# Patient Record
Sex: Male | Born: 1965 | Race: Black or African American | Hispanic: No | Marital: Married | Smoking: Never smoker
Health system: Southern US, Community
[De-identification: ages and names within clinical notes are randomized; demographics above are authoritative.]

## PROBLEM LIST (undated history)

## (undated) DIAGNOSIS — E119 Type 2 diabetes mellitus without complications: Secondary | ICD-10-CM

## (undated) DIAGNOSIS — I10 Essential (primary) hypertension: Secondary | ICD-10-CM

---

## 2002-07-16 ENCOUNTER — Emergency Department (HOSPITAL_COMMUNITY): Admission: EM | Admit: 2002-07-16 | Discharge: 2002-07-17 | Payer: Self-pay | Admitting: Emergency Medicine

## 2002-07-17 ENCOUNTER — Ambulatory Visit (HOSPITAL_COMMUNITY): Admission: RE | Admit: 2002-07-17 | Discharge: 2002-07-17 | Payer: Self-pay | Admitting: Emergency Medicine

## 2015-09-08 ENCOUNTER — Encounter: Payer: Self-pay | Admitting: Internal Medicine

## 2015-12-14 ENCOUNTER — Emergency Department (HOSPITAL_COMMUNITY)
Admission: EM | Admit: 2015-12-14 | Discharge: 2015-12-15 | Disposition: A | Discharge status: Home / Self Care | Payer: BLUE CROSS/BLUE SHIELD | Attending: Emergency Medicine | Admitting: Emergency Medicine

## 2015-12-14 ENCOUNTER — Emergency Department (HOSPITAL_COMMUNITY): Payer: BLUE CROSS/BLUE SHIELD

## 2015-12-14 ENCOUNTER — Encounter (HOSPITAL_COMMUNITY): Payer: Self-pay | Admitting: Emergency Medicine

## 2015-12-14 DIAGNOSIS — M25521 Pain in right elbow: Secondary | ICD-10-CM | POA: Insufficient documentation

## 2015-12-14 DIAGNOSIS — R6883 Chills (without fever): Secondary | ICD-10-CM | POA: Diagnosis not present

## 2015-12-14 DIAGNOSIS — Z7984 Long term (current) use of oral hypoglycemic drugs: Secondary | ICD-10-CM | POA: Diagnosis not present

## 2015-12-14 DIAGNOSIS — E119 Type 2 diabetes mellitus without complications: Secondary | ICD-10-CM | POA: Diagnosis not present

## 2015-12-14 DIAGNOSIS — I10 Essential (primary) hypertension: Secondary | ICD-10-CM | POA: Diagnosis not present

## 2015-12-14 DIAGNOSIS — Z79899 Other long term (current) drug therapy: Secondary | ICD-10-CM | POA: Insufficient documentation

## 2015-12-14 DIAGNOSIS — M25421 Effusion, right elbow: Secondary | ICD-10-CM | POA: Diagnosis not present

## 2015-12-14 HISTORY — DX: Essential (primary) hypertension: I10

## 2015-12-14 HISTORY — DX: Type 2 diabetes mellitus without complications: E11.9

## 2015-12-14 MED ORDER — HYDROMORPHONE HCL 1 MG/ML IJ SOLN
1.0000 mg | Freq: Once | INTRAMUSCULAR | Status: AC
  Administered 2015-12-14: 1 mg via INTRAMUSCULAR
  Filled 2015-12-14: qty 1

## 2015-12-14 MED ORDER — KETOROLAC TROMETHAMINE 30 MG/ML IJ SOLN
30.0000 mg | Freq: Once | INTRAMUSCULAR | Status: AC
Start: 2015-12-14 — End: 2015-12-14
  Administered 2015-12-14: 30 mg via INTRAMUSCULAR
  Filled 2015-12-14: qty 1

## 2015-12-14 MED ORDER — OXYCODONE-ACETAMINOPHEN 5-325 MG PO TABS
1.0000 | ORAL_TABLET | Freq: Once | ORAL | Status: AC
  Administered 2015-12-14: 1 via ORAL
  Filled 2015-12-14: qty 1

## 2015-12-14 NOTE — ED Notes (Signed)
Patient presents for gout flare up in right elbow x1 day. Reports increased swelling of elbow. Sent by PCP for xray.

## 2015-12-14 NOTE — ED Provider Notes (Signed)
CSN: [id number]     Arrival date & time 12/14/15  1934 History  By signing my name below, I, Tanda Rockers, attest that this documentation has been prepared under the direction and in the presence of Gerhard Munch, MD. Electronically Signed: Tanda Rockers, ED Scribe. 12/14/2015. 11:35 PM.   Chief Complaint  Patient presents with  . Joint Swelling   The history is provided by the patient. No language interpreter was used.     HPI Comments: Mark Ingram is a 50 y.o. male with PMHx gout, DM, and HTN who presents to the Emergency Department complaining of gradual onset, constant, right elbow pain and swelling x 1 day. Pt was seen at [facility] today for his symptoms and sent here for further evaluation and possible arthrocentesis of his elbow. He reports feeling chills while at his PCPs as well. Pt usually has gout in his feet and his toes but has never had it in his elbow. Denies fever, vomiting, weakness, numbness, tingling, confusion, syncope, speech difficulty, color changes, or any other associated symptoms. Pt has hx DM and HTN and states his sugars and blood pressure have been running well lately.    Past Medical History  Diagnosis Date  . Diabetes mellitus without complication (HCC)   . Hypertension    History reviewed. No pertinent past surgical history. No family history on file. Social History  Substance Use Topics  . Smoking status: Never Smoker   . Smokeless tobacco: None  . Alcohol Use: No    Review of Systems  Constitutional: Positive for chills. Negative for fever.  Respiratory: Negative.   Cardiovascular: Negative.   Gastrointestinal: Negative for vomiting.  Musculoskeletal: Positive for joint swelling and arthralgias.  Skin: Negative for color change.  Allergic/Immunologic: Negative for immunocompromised state.  Neurological: Negative for syncope, speech difficulty, weakness and numbness.  Psychiatric/Behavioral: Negative for confusion.   Allergies   Review of patient's allergies indicates no known allergies.  Home Medications   Prior to Admission medications   Medication Sig Start Date End Date Taking? Authorizing Provider  lisinopril (PRINIVIL,ZESTRIL) 40 MG tablet Take 40 mg by mouth daily. 11/21/15  Yes Historical Provider, MD  metFORMIN (GLUCOPHAGE) 1000 MG tablet Take 1,000 mg by mouth 2 (two) times daily with a meal.  11/21/15  Yes Historical Provider, MD  simvastatin (ZOCOR) 20 MG tablet Take 20 mg by mouth daily. 11/21/15  Yes Historical Provider, MD  TRADJENTA 5 MG TABS tablet Take 5 mg by mouth daily. 11/13/15  Yes Historical Provider, MD   BP 133/92 mmHg  Pulse 66  Temp(Src) 98.8 F (37.1 C) (Oral)  Resp 20  SpO2 97%   Physical Exam  Constitutional: He appears well-developed and well-nourished. No distress.  HENT:  Head: Normocephalic and atraumatic.  Eyes: Conjunctivae are normal. Right eye exhibits no discharge. Left eye exhibits no discharge.  Neck: No tracheal deviation present. No thyromegaly present.  Cardiovascular: Regular rhythm and intact distal pulses.   Pulmonary/Chest: Effort normal and breath sounds normal. No respiratory distress.  Musculoskeletal:  Restrictive ROM of right elbow about 160 degrees and 90 degrees Minimal appreciable effusion, no warmth, no discoloration  Neurological: He is alert. No cranial nerve deficit. He exhibits normal muscle tone. Coordination normal.  Skin: Skin is warm and dry. He is not diaphoretic. No erythema.  Psychiatric: He has a normal mood and affect.    ED Course  Procedures (including critical care time)  DIAGNOSTIC STUDIES: Oxygen Saturation is 97% on RA, normal by my interpretation.  COORDINATION OF CARE: 11:34 PM-Discussed treatment plan with pt at bedside and pt agreed to plan.   Labs Review Labs Reviewed  BASIC METABOLIC PANEL - Abnormal; Notable for the following:    Glucose, Bld 279 (*)    All other components within normal limits  CBC WITH  DIFFERENTIAL/PLATELET  URIC ACID    Imaging Review Dg Elbow Complete Right  12/14/2015  CLINICAL DATA:  Joint swelling.  Gout EXAM: RIGHT ELBOW - COMPLETE 3+ VIEW COMPARISON:  None. FINDINGS: Negative for fracture. Spurring of the coronoid process appears chronic. Prominent joint effusion.  No erosion of gout identified. IMPRESSION: Moderate to large joint effusion. Negative for fracture or erosion. Electronically Signed   By: Marlan Palauharles  Clark M.D.   On: 12/14/2015 20:10   I have personally reviewed and evaluated these images and lab results as part of my medical decision-making.  On repeat exam the patient appears calm, no new complaints. Pain well-controlled here. He remains afebrile. We discussed return precautions, follow-up instructions.  MDM   I personally performed the services described in this documentation, which was scribed in my presence. The recorded information has been reviewed and is accurate.    Well-appearing male presents with new right elbow pain, small effusion. Patient has a history of gout. Here, the patient is awake, alert, afebrile, normotensive, with no superficial skin color changes, warmth, no distal neurovascular changes. Patient's symptoms likely secondary to gout, with low suspicion for septic arthritis, as he is not immunocompromised, is well appearing, afebrile, with no superficial changes. The joint itself does not have an area amenable to arthrocentesis. Patient started on medication, anti-inflammatory, will follow up with orthopedics.  Gerhard Munchobert Salam Micucci, MD 12/15/15 [phone]

## 2015-12-15 LAB — CBC WITH DIFFERENTIAL/PLATELET
BASOS PCT: 0 %
Basophils Absolute: 0 10*3/uL (ref 0.0–0.1)
EOS ABS: 0 10*3/uL (ref 0.0–0.7)
Eosinophils Relative: 0 %
HEMATOCRIT: 42.9 % (ref 39.0–52.0)
HEMOGLOBIN: 14.3 g/dL (ref 13.0–17.0)
Lymphocytes Relative: 22 %
Lymphs Abs: 1.9 10*3/uL (ref 0.7–4.0)
MCH: 30.7 pg (ref 26.0–34.0)
MCHC: 33.3 g/dL (ref 30.0–36.0)
MCV: 92.1 fL (ref 78.0–100.0)
Monocytes Absolute: 0.9 10*3/uL (ref 0.1–1.0)
Monocytes Relative: 10 %
NEUTROS ABS: 5.8 10*3/uL (ref 1.7–7.7)
NEUTROS PCT: 68 %
Platelets: 153 10*3/uL (ref 150–400)
RBC: 4.66 MIL/uL (ref 4.22–5.81)
RDW: 14.1 % (ref 11.5–15.5)
WBC: 8.7 10*3/uL (ref 4.0–10.5)

## 2015-12-15 LAB — BASIC METABOLIC PANEL
ANION GAP: 6 (ref 5–15)
BUN: 15 mg/dL (ref 6–20)
CALCIUM: 9.6 mg/dL (ref 8.9–10.3)
CO2: 28 mmol/L (ref 22–32)
CREATININE: 1.24 mg/dL (ref 0.61–1.24)
Chloride: 102 mmol/L (ref 101–111)
Glucose, Bld: 279 mg/dL — ABNORMAL HIGH (ref 65–99)
Potassium: 4 mmol/L (ref 3.5–5.1)
SODIUM: 136 mmol/L (ref 135–145)

## 2015-12-15 LAB — URIC ACID: URIC ACID, SERUM: 7.5 mg/dL (ref 4.4–7.6)

## 2015-12-15 MED ORDER — HYDROCODONE-ACETAMINOPHEN 5-325 MG PO TABS: 1.0000 | ORAL_TABLET | Freq: Four times a day (QID) | ORAL | Status: DC | PRN

## 2015-12-15 MED ORDER — INDOMETHACIN 25 MG PO CAPS: 25.0000 mg | ORAL_CAPSULE | Freq: Three times a day (TID) | ORAL | Status: DC

## 2015-12-15 NOTE — Discharge Instructions (Signed)
As discussed, with your swelling and pain of the right elbow it is important that you take all medication as directed, and be sure to follow-up with our orthopedic colleagues.  Return here for concerning changes in your condition.  In addition to the prescribed medication, please use ice packs, 4 times daily for additional relief.

## 2017-01-19 DIAGNOSIS — E785 Hyperlipidemia, unspecified: Secondary | ICD-10-CM | POA: Diagnosis not present

## 2017-01-19 DIAGNOSIS — I10 Essential (primary) hypertension: Secondary | ICD-10-CM | POA: Diagnosis not present

## 2017-01-19 DIAGNOSIS — M109 Gout, unspecified: Secondary | ICD-10-CM | POA: Diagnosis not present

## 2017-01-19 DIAGNOSIS — E119 Type 2 diabetes mellitus without complications: Secondary | ICD-10-CM | POA: Diagnosis not present

## 2017-03-04 IMAGING — CR DG ELBOW COMPLETE 3+V*R*
4 series · 4 of 4 positions shown · non-contrast
Comparison: None.

CLINICAL DATA: Joint swelling.  Gout

EXAM:
RIGHT ELBOW - COMPLETE 3+ VIEW

[x elbow ap right]
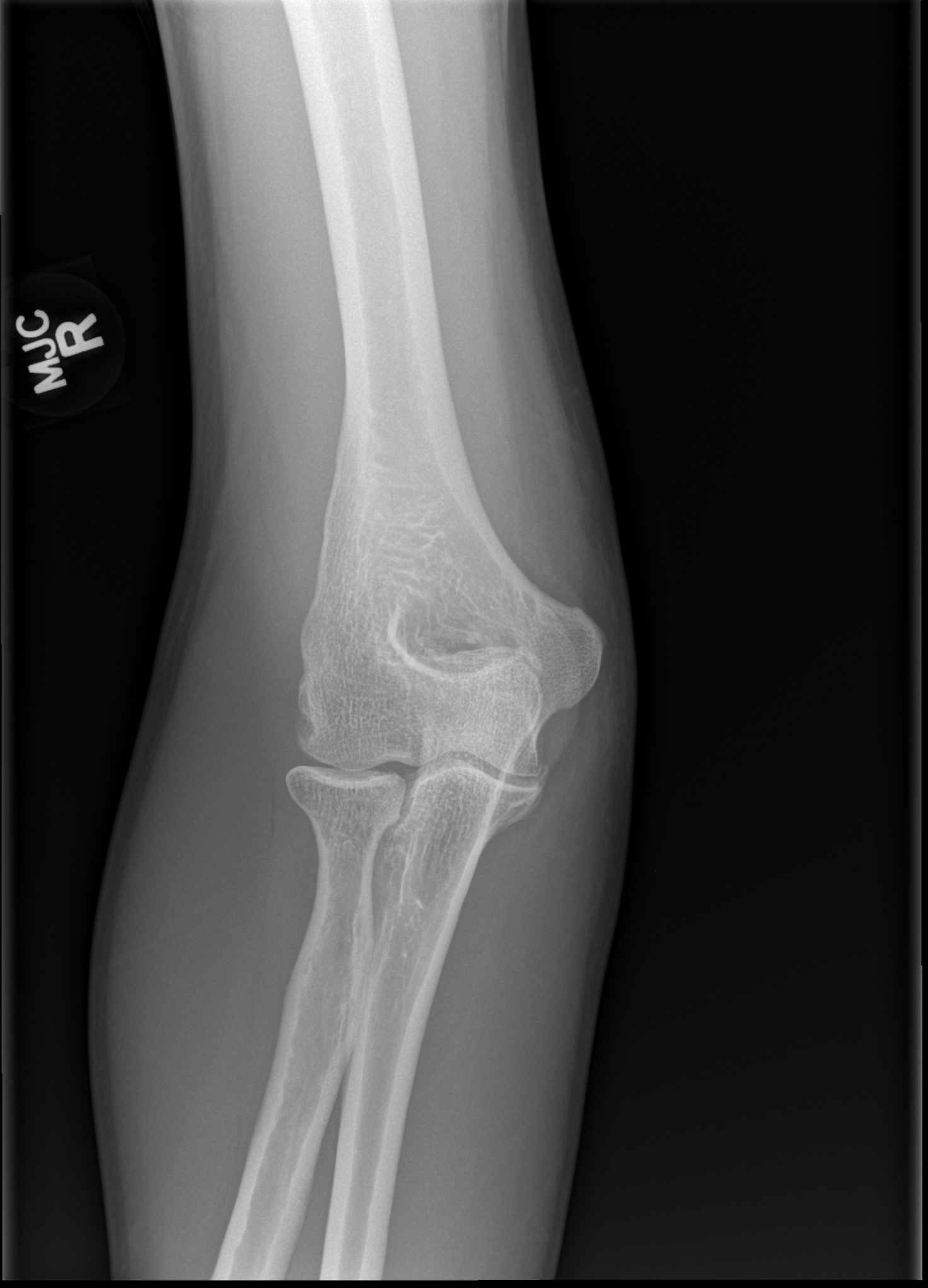

[x elbow obl right (1 of 2)]
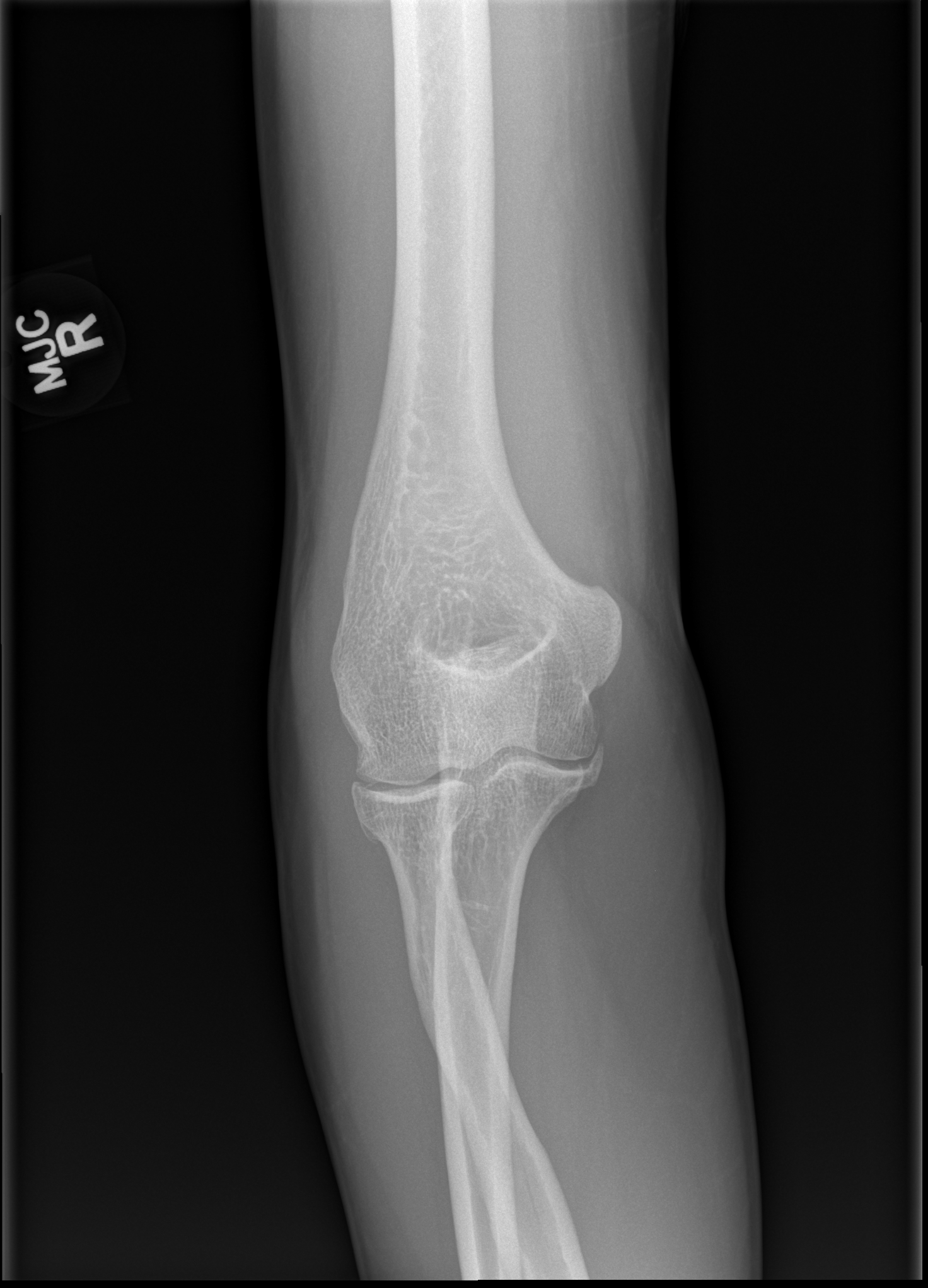

[x elbow obl right (2 of 2)]
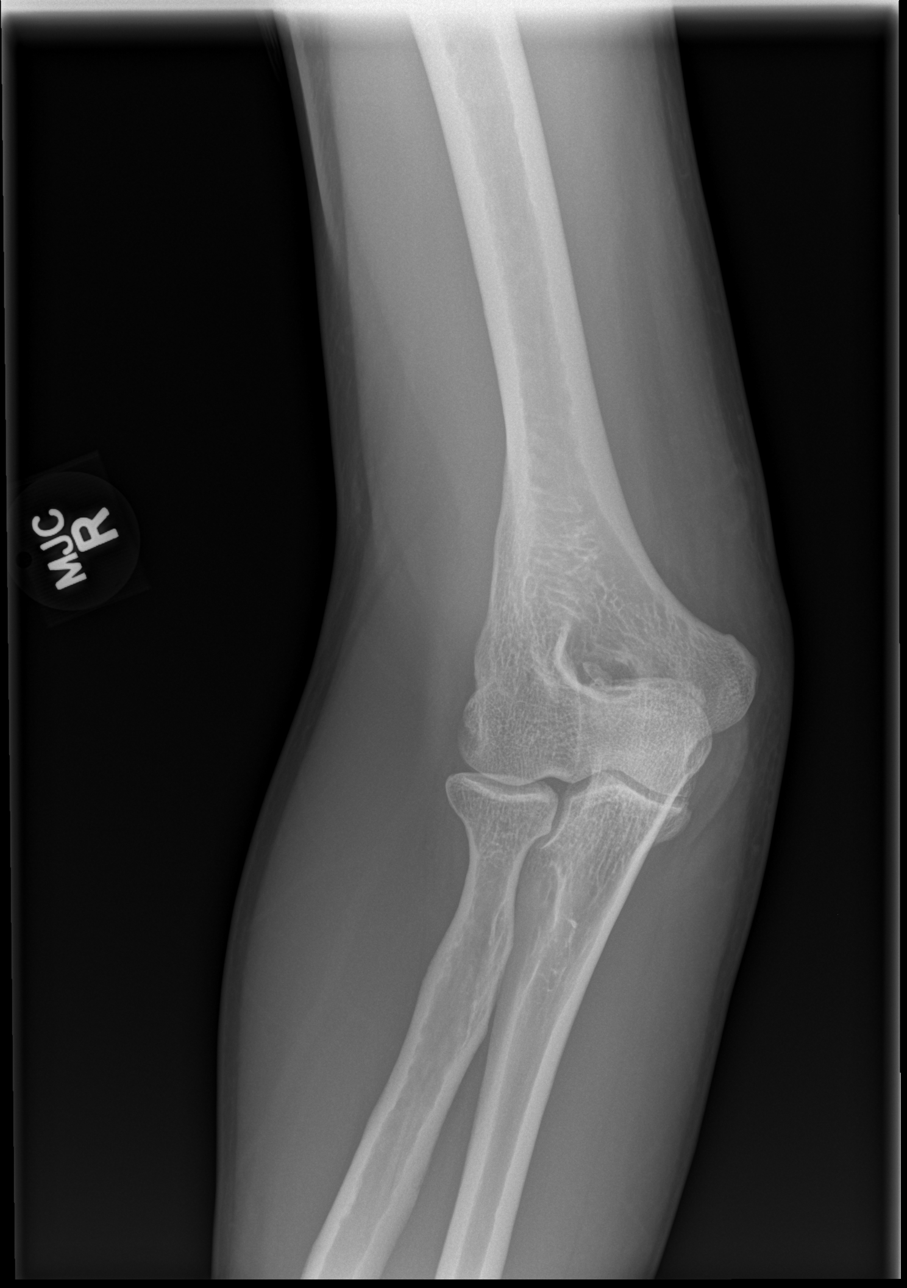

[x elbow lat right]
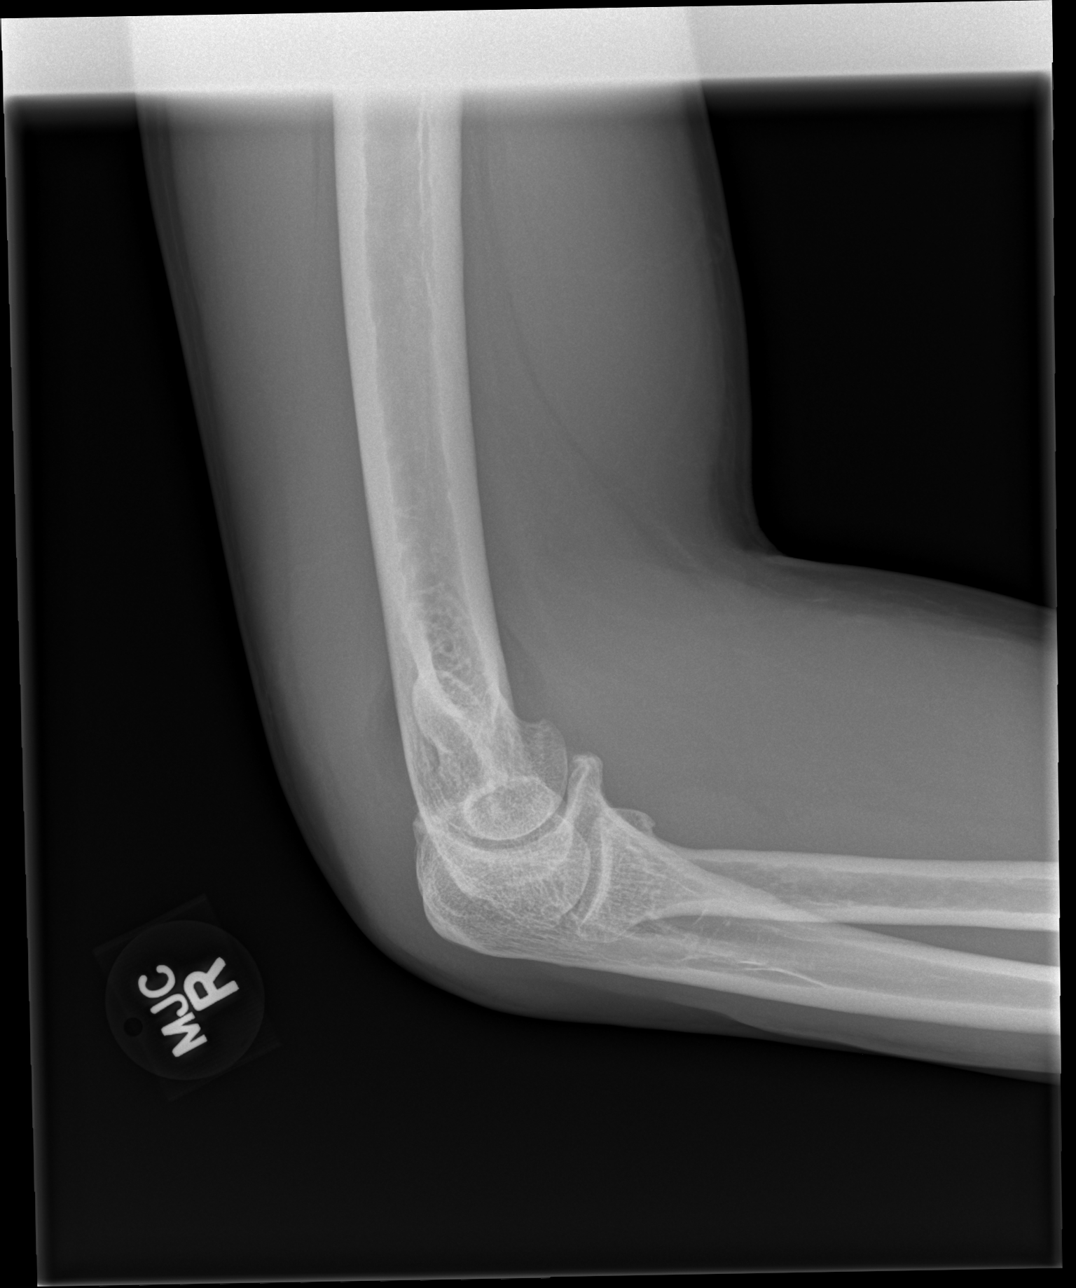

[4 of 4 positions shown; findings below may reference images not displayed]

FINDINGS: Negative for fracture. Spurring of the coronoid process appears
chronic.

Prominent joint effusion.  No erosion of gout identified.
IMPRESSION: Moderate to large joint effusion. Negative for fracture or erosion.

## 2017-07-06 DIAGNOSIS — E119 Type 2 diabetes mellitus without complications: Secondary | ICD-10-CM | POA: Diagnosis not present

## 2017-07-06 DIAGNOSIS — I10 Essential (primary) hypertension: Secondary | ICD-10-CM | POA: Diagnosis not present

## 2017-07-06 DIAGNOSIS — E785 Hyperlipidemia, unspecified: Secondary | ICD-10-CM | POA: Diagnosis not present

## 2018-05-08 DIAGNOSIS — E785 Hyperlipidemia, unspecified: Secondary | ICD-10-CM | POA: Diagnosis not present

## 2018-05-08 DIAGNOSIS — N529 Male erectile dysfunction, unspecified: Secondary | ICD-10-CM | POA: Diagnosis not present

## 2018-05-08 DIAGNOSIS — E119 Type 2 diabetes mellitus without complications: Secondary | ICD-10-CM | POA: Diagnosis not present

## 2018-05-08 DIAGNOSIS — I10 Essential (primary) hypertension: Secondary | ICD-10-CM | POA: Diagnosis not present

## 2018-08-13 DIAGNOSIS — E785 Hyperlipidemia, unspecified: Secondary | ICD-10-CM | POA: Diagnosis not present

## 2018-08-13 DIAGNOSIS — I10 Essential (primary) hypertension: Secondary | ICD-10-CM | POA: Diagnosis not present

## 2018-08-13 DIAGNOSIS — N529 Male erectile dysfunction, unspecified: Secondary | ICD-10-CM | POA: Diagnosis not present

## 2018-08-13 DIAGNOSIS — E119 Type 2 diabetes mellitus without complications: Secondary | ICD-10-CM | POA: Diagnosis not present

## 2018-08-13 DIAGNOSIS — Z23 Encounter for immunization: Secondary | ICD-10-CM | POA: Diagnosis not present

## 2019-01-11 DIAGNOSIS — E119 Type 2 diabetes mellitus without complications: Secondary | ICD-10-CM | POA: Diagnosis not present

## 2019-01-11 DIAGNOSIS — M109 Gout, unspecified: Secondary | ICD-10-CM | POA: Diagnosis not present

## 2019-01-11 DIAGNOSIS — N529 Male erectile dysfunction, unspecified: Secondary | ICD-10-CM | POA: Diagnosis not present

## 2019-01-11 DIAGNOSIS — E785 Hyperlipidemia, unspecified: Secondary | ICD-10-CM | POA: Diagnosis not present

## 2019-01-11 DIAGNOSIS — I10 Essential (primary) hypertension: Secondary | ICD-10-CM | POA: Diagnosis not present

## 2019-06-10 DIAGNOSIS — E785 Hyperlipidemia, unspecified: Secondary | ICD-10-CM | POA: Diagnosis not present

## 2019-06-10 DIAGNOSIS — E119 Type 2 diabetes mellitus without complications: Secondary | ICD-10-CM | POA: Diagnosis not present

## 2019-06-10 DIAGNOSIS — M109 Gout, unspecified: Secondary | ICD-10-CM | POA: Diagnosis not present

## 2019-06-10 DIAGNOSIS — N529 Male erectile dysfunction, unspecified: Secondary | ICD-10-CM | POA: Diagnosis not present

## 2019-06-10 DIAGNOSIS — I10 Essential (primary) hypertension: Secondary | ICD-10-CM | POA: Diagnosis not present

## 2019-08-15 DIAGNOSIS — Z1159 Encounter for screening for other viral diseases: Secondary | ICD-10-CM | POA: Diagnosis not present

## 2019-08-20 DIAGNOSIS — Z1211 Encounter for screening for malignant neoplasm of colon: Secondary | ICD-10-CM | POA: Diagnosis not present

## 2019-10-18 DIAGNOSIS — E785 Hyperlipidemia, unspecified: Secondary | ICD-10-CM | POA: Diagnosis not present

## 2019-10-18 DIAGNOSIS — I10 Essential (primary) hypertension: Secondary | ICD-10-CM | POA: Diagnosis not present

## 2019-10-18 DIAGNOSIS — E119 Type 2 diabetes mellitus without complications: Secondary | ICD-10-CM | POA: Diagnosis not present

## 2019-10-18 DIAGNOSIS — M109 Gout, unspecified: Secondary | ICD-10-CM | POA: Diagnosis not present

## 2020-03-28 ENCOUNTER — Ambulatory Visit: Payer: BLUE CROSS/BLUE SHIELD | Attending: Internal Medicine

## 2020-03-28 DIAGNOSIS — Z23 Encounter for immunization: Secondary | ICD-10-CM

## 2020-03-28 NOTE — Progress Notes (Signed)
   Covid-19 Vaccination Clinic  Name:  Mark Ingram    MRN: [id number] DOB: [birth year]  03/28/2020  Mr. [name] was observed post Covid-19 immunization for 15 minutes without incident. He was provided with Vaccine Information Sheet and instruction to access the V-Safe system.   Mr. [name] was instructed to call 911 with any severe reactions post vaccine: Marland Kitchen Difficulty breathing  . Swelling of face and throat  . A fast heartbeat  . A bad rash all over body  . Dizziness and weakness   Immunizations Administered    Name Date Dose VIS Date Route   Pfizer COVID-19 Vaccine 03/28/2020 12:04 PM 0.3 mL 12/04/2018 Intramuscular   Manufacturer: [employer], [employer]   Lot: [id number]   NDC: [address]-2

## 2020-04-18 ENCOUNTER — Ambulatory Visit: Payer: BLUE CROSS/BLUE SHIELD | Attending: Internal Medicine

## 2020-04-18 DIAGNOSIS — Z23 Encounter for immunization: Secondary | ICD-10-CM

## 2020-04-18 NOTE — Progress Notes (Signed)
   Covid-19 Vaccination Clinic  Name:  OLUWAFERANMI WAIN    MRN: [id number] DOB: Mar 16, 1966  04/18/2020  Mr. Oommen was observed post Covid-19 immunization for 15 minutes without incident. He was provided with Vaccine Information Sheet and instruction to access the V-Safe system.   Mr. Germer was instructed to call 911 with any severe reactions post vaccine: Marland Kitchen Difficulty breathing  . Swelling of face and throat  . A fast heartbeat  . A bad rash all over body  . Dizziness and weakness   Immunizations Administered    Name Date Dose VIS Date Route   Pfizer COVID-19 Vaccine 04/18/2020  8:11 AM 0.3 mL 12/04/2018 Intramuscular   Manufacturer: [employer], [employer]   Lot: [id number]   NDC: [address]-8

## 2020-06-01 ENCOUNTER — Other Ambulatory Visit: Payer: Self-pay

## 2020-06-01 ENCOUNTER — Ambulatory Visit
Admission: EM | Admit: 2020-06-01 | Discharge: 2020-06-01 | Disposition: A | Discharge status: Home / Self Care | Payer: 59

## 2020-06-01 ENCOUNTER — Encounter: Payer: Self-pay | Admitting: Emergency Medicine

## 2020-06-01 DIAGNOSIS — M109 Gout, unspecified: Secondary | ICD-10-CM | POA: Diagnosis not present

## 2020-06-01 MED ORDER — METHYLPREDNISOLONE SODIUM SUCC 125 MG IJ SOLR
125.0000 mg | Freq: Once | INTRAMUSCULAR | Status: AC
  Administered 2020-06-01: 125 mg via INTRAMUSCULAR

## 2020-06-01 NOTE — Discharge Instructions (Addendum)
Read attached info on foods to prevent gout. Important to follow up with specialist(s) below for further evaluation/management if your symptoms persist or worsen

## 2020-06-01 NOTE — ED Provider Notes (Signed)
[facility]    CSN: [id number] Arrival date & time: 06/01/20  0807      History   Chief Complaint Chief Complaint  Patient presents with  . Foot Pain    HPI Mark Ingram is a 54 y.o. male  Presenting for gout flare to right great toe, MTP.  Endorses history thereof: States last flare was a few months ago.  Has been ongoing for 3 days: No inciting event, trauma, numbness, deformity or discoloration.  Does admit to alcohol consumption week prior: Not typical for him.  Compliant with albuterol.  Past Medical History:  Diagnosis Date  . Diabetes mellitus without complication (HCC)   . Hypertension     There are no problems to display for this patient.   History reviewed. No pertinent surgical history.     Home Medications    Prior to Admission medications   Medication Sig Start Date End Date Taking? Authorizing Provider  allopurinol (ZYLOPRIM) 100 MG tablet Take 100 mg by mouth daily.   Yes [provider]  lisinopril (PRINIVIL,ZESTRIL) 40 MG tablet Take 40 mg by mouth daily. 11/21/15   [provider]  metFORMIN (GLUCOPHAGE) 1000 MG tablet Take 1,000 mg by mouth 2 (two) times daily with a meal.  11/21/15   [provider]  simvastatin (ZOCOR) 20 MG tablet Take 20 mg by mouth daily. 11/21/15   [provider]  TRADJENTA 5 MG TABS tablet Take 5 mg by mouth daily. 11/13/15   [provider]    Family History History reviewed. No pertinent family history.  Social History Social History   Tobacco Use  . Smoking status: Never Smoker  Substance Use Topics  . Alcohol use: No  . Drug use: No     Allergies   Patient has no known allergies.   Review of Systems Review of Systems  Constitutional: Negative for fever.  Respiratory: Negative for shortness of breath.   Cardiovascular: Negative for chest pain.  All other systems reviewed and are negative.    Physical Exam Triage Vital Signs ED Triage Vitals   Enc Vitals Group     BP      Pulse      Resp      Temp      Temp src      SpO2      Weight      Height      Head Circumference      Peak Flow      Pain Score      Pain Loc      Pain Edu?      Excl. in GC?    No data found.  Updated Vital Signs BP (!) 158/102 (BP Location: Left Arm)   Pulse 72   Temp 98.6 F (37 C) (Oral)   Resp 18   SpO2 98%   Visual Acuity Right Eye Distance:   Left Eye Distance:   Bilateral Distance:    Right Eye Near:   Left Eye Near:    Bilateral Near:     Physical Exam Constitutional:      General: He is not in acute distress. HENT:     Head: Normocephalic and atraumatic.  Eyes:     General: No scleral icterus.    Pupils: Pupils are equal, round, and reactive to light.  Cardiovascular:     Rate and Rhythm: Normal rate.  Pulmonary:     Effort: Pulmonary effort is normal. No respiratory distress.  Breath sounds: No wheezing.  Musculoskeletal:     Comments: Right great toe with swelling, tenderness at MTP.  Warm and erythematous.  No lesions, deformity.  NVI  Skin:    Coloration: Skin is not jaundiced or pale.  Neurological:     Mental Status: He is alert and oriented to person, place, and time.      UC Treatments / Results  Labs (all labs ordered are listed, but only abnormal results are displayed) Labs Reviewed - No data to display  EKG   Radiology No results found.  Procedures Procedures (including critical care time)  Medications Ordered in UC Medications  methylPREDNISolone sodium succinate (SOLU-MEDROL) 125 mg/2 mL injection 125 mg (125 mg Intramuscular Given 06/01/20 0852)    Initial Impression / Assessment and Plan / UC Course  I have reviewed the triage vital signs and the nursing notes.  Pertinent labs & imaging results that were available during my care of the patient were reviewed by me and considered in my medical decision making (see chart for details).     H&P consistent with gout flare: Given  Solu-Medrol in office which he tolerated well.  Will follow up with PCP for further eval/management.  Return precautions discussed, pt verbalized understanding and is agreeable to plan. Final Clinical Impressions(s) / UC Diagnoses   Final diagnoses:  Acute gout involving toe of right foot, unspecified cause     Discharge Instructions     Read attached info on foods to prevent gout. Important to follow up with specialist(s) below for further evaluation/management if your symptoms persist or worsen    ED Prescriptions    None     PDMP not reviewed this encounter.   Odette Fraction [place], [place] 06/01/20 [phone]

## 2020-06-01 NOTE — ED Triage Notes (Signed)
Pt here for right foot pain from gout with hx of same x 3 days

## 2022-09-23 ENCOUNTER — Encounter: Payer: Self-pay | Admitting: Emergency Medicine

## 2022-09-23 ENCOUNTER — Ambulatory Visit
Admission: EM | Admit: 2022-09-23 | Discharge: 2022-09-23 | Disposition: A | Discharge status: Home / Self Care | Payer: 59

## 2022-09-23 DIAGNOSIS — J069 Acute upper respiratory infection, unspecified: Secondary | ICD-10-CM

## 2022-09-23 MED ORDER — BENZONATATE 100 MG PO CAPS: 100.0000 mg | ORAL_CAPSULE | Freq: Three times a day (TID) | ORAL | 0 refills | Status: AC | PRN

## 2022-09-23 MED ORDER — FLUTICASONE PROPIONATE 50 MCG/ACT NA SUSP: 1.0000 | Freq: Every day | NASAL | 0 refills | Status: AC

## 2022-09-23 NOTE — Discharge Instructions (Signed)
You have a viral upper respiratory infection which should run its course and self resolve with symptomatic treatment as we discussed.  I have prescribed you a few medications to help alleviate symptoms.  Please follow-up if symptoms persist or worsen.

## 2022-09-23 NOTE — ED Triage Notes (Signed)
Pt is present today with c/o cough,chills, and nasal congestion x4 days

## 2022-09-23 NOTE — ED Provider Notes (Signed)
[facility]    CSN: [id number] Arrival date & time: 09/23/22  1127      History   Chief Complaint Chief Complaint  Patient presents with   Cough   Chills   Nasal Congestion    HPI Mark Ingram is a 56 y.o. male.   Patient presents with cough, chills, nasal congestion that started about 4 days ago.  Patient denies any known sick contacts or fever at home.  Denies chest pain, shortness of breath, sore throat, ear pain, nausea, vomiting, diarrhea, abdominal pain.  Patient has not taken any medications to alleviate symptoms.  Patient denies history of asthma or COPD.  Patient has mildly elevated blood pressure reading and reports that he has been taking his blood pressure medication at home.  Denies headache, blurred vision, dizziness.   Cough   Past Medical History:  Diagnosis Date   Diabetes mellitus without complication (HCC)    Hypertension     There are no problems to display for this patient.   History reviewed. No pertinent surgical history.     Home Medications    Prior to Admission medications   Medication Sig Start Date End Date Taking? Authorizing Provider  amLODipine (NORVASC) 5 MG tablet Take 5 mg by mouth daily. 06/12/22  Yes [provider]  benzonatate (TESSALON) 100 MG capsule Take 1 capsule (100 mg total) by mouth every 8 (eight) hours as needed for cough. 09/23/22  Yes Fiorela Pelzer, Rolly Salter E, FNP  fluticasone (FLONASE) 50 MCG/ACT nasal spray Place 1 spray into both nostrils daily. 09/23/22  Yes Dan Dissinger, Rolly Salter E, FNP  telmisartan (MICARDIS) 80 MG tablet Take 80 mg by mouth daily. 06/12/22  Yes [provider]  allopurinol (ZYLOPRIM) 100 MG tablet Take 100 mg by mouth daily.    [provider]  lisinopril (PRINIVIL,ZESTRIL) 40 MG tablet Take 40 mg by mouth daily. 11/21/15   [provider]  metFORMIN (GLUCOPHAGE) 1000 MG tablet Take 1,000 mg by mouth 2 (two) times daily with a meal.  11/21/15   [provider]  simvastatin (ZOCOR) 20 MG tablet Take 20 mg by mouth daily. 11/21/15   [provider]  TRADJENTA 5 MG TABS tablet Take 5 mg by mouth daily. 11/13/15   [provider]    Family History History reviewed. No pertinent family history.  Social History Social History   Tobacco Use   Smoking status: Never  Substance Use Topics   Alcohol use: No   Drug use: No     Allergies   Patient has no known allergies.   Review of Systems Review of Systems Per HPI  Physical Exam Triage Vital Signs ED Triage Vitals  Enc Vitals Group     BP 09/23/22 1245 (!) 162/94     Pulse Rate 09/23/22 1245 71     Resp 09/23/22 1245 16     Temp 09/23/22 1245 98 F (36.7 C)     Temp src --      SpO2 09/23/22 1245 98 %     Weight --      Height --      Head Circumference --      Peak Flow --      Pain Score 09/23/22 1244 0     Pain Loc --      Pain Edu? --      Excl. in GC? --    No data found.  Updated Vital Signs BP (!) 146/88   Pulse 71  Temp 98 F (36.7 C)   Resp 16   SpO2 98%   Visual Acuity Right Eye Distance:   Left Eye Distance:   Bilateral Distance:    Right Eye Near:   Left Eye Near:    Bilateral Near:     Physical Exam Constitutional:      General: He is not in acute distress.    Appearance: Normal appearance. He is not toxic-appearing or diaphoretic.  HENT:     Head: Normocephalic and atraumatic.     Right Ear: Tympanic membrane and ear canal normal.     Left Ear: Tympanic membrane and ear canal normal.     Nose: Congestion present.     Mouth/Throat:     Mouth: Mucous membranes are moist.     Pharynx: No posterior oropharyngeal erythema.  Eyes:     Extraocular Movements: Extraocular movements intact.     Conjunctiva/sclera: Conjunctivae normal.     Pupils: Pupils are equal, round, and reactive to light.  Cardiovascular:     Rate and Rhythm: Normal rate and regular rhythm.     Pulses: Normal pulses.     Heart sounds: Normal heart sounds.   Pulmonary:     Effort: Pulmonary effort is normal. No respiratory distress.     Breath sounds: Normal breath sounds. No stridor. No wheezing, rhonchi or rales.  Abdominal:     General: Abdomen is flat. Bowel sounds are normal.     Palpations: Abdomen is soft.  Musculoskeletal:        General: Normal range of motion.     Cervical back: Normal range of motion.  Skin:    General: Skin is warm and dry.  Neurological:     General: No focal deficit present.     Mental Status: He is alert and oriented to person, place, and time. Mental status is at baseline.  Psychiatric:        Mood and Affect: Mood normal.        Behavior: Behavior normal.      UC Treatments / Results  Labs (all labs ordered are listed, but only abnormal results are displayed) Labs Reviewed - No data to display  EKG   Radiology No results found.  Procedures Procedures (including critical care time)  Medications Ordered in UC Medications - No data to display  Initial Impression / Assessment and Plan / UC Course  I have reviewed the triage vital signs and the nursing notes.  Pertinent labs & imaging results that were available during my care of the patient were reviewed by me and considered in my medical decision making (see chart for details).     Patient presents with symptoms likely from a viral upper respiratory infection. Differential includes bacterial pneumonia, sinusitis, allergic rhinitis, COVID-19, flu, RSV. Do not suspect underlying cardiopulmonary process. Symptoms seem unlikely related to ACS, CHF or COPD exacerbations, pneumonia, pneumothorax. Patient is nontoxic appearing and not in need of emergent medical intervention.  Patient declined viral testing.  Recommended symptom control with medications and supportive care that are safe with hypertension.  Patient sent prescriptions.  Patient had mildly elevated blood pressure reading with recheck being improved.  Patient advised to monitor blood  pressure at home and follow-up with PCP or urgent care if it remains elevated.  Patient is asymptomatic regarding blood pressure so do not think that emergent evaluation is necessary.  Return if symptoms fail to improve in 1-2 weeks or you develop shortness of breath, chest pain, severe headache. Patient  states understanding and is agreeable.  Discharged with PCP followup.  Final Clinical Impressions(s) / UC Diagnoses   Final diagnoses:  Viral upper respiratory tract infection with cough     Discharge Instructions      You have a viral upper respiratory infection which should run its course and self resolve with symptomatic treatment as we discussed.  I have prescribed you a few medications to help alleviate symptoms.  Please follow-up if symptoms persist or worsen.    ED Prescriptions     Medication Sig Dispense Auth. Provider   fluticasone (FLONASE) 50 MCG/ACT nasal spray Place 1 spray into both nostrils daily. 16 g Maigen Mozingo, Rolly Salter E, [place]   benzonatate (TESSALON) 100 MG capsule Take 1 capsule (100 mg total) by mouth every 8 (eight) hours as needed for cough. 21 capsule [place], Acie Fredrickson, [place]      PDMP not reviewed this encounter.   Gustavus Bryant, [place] 09/23/22 1330

## 2023-11-13 ENCOUNTER — Ambulatory Visit
Admission: EM | Admit: 2023-11-13 | Discharge: 2023-11-13 | Disposition: A | Discharge status: Home / Self Care | Payer: 59

## 2023-11-13 ENCOUNTER — Encounter: Payer: Self-pay | Admitting: Emergency Medicine

## 2023-11-13 DIAGNOSIS — J069 Acute upper respiratory infection, unspecified: Secondary | ICD-10-CM | POA: Diagnosis not present

## 2023-11-13 LAB — POCT INFLUENZA A/B
Influenza A, POC: NEGATIVE
Influenza B, POC: NEGATIVE

## 2023-11-13 LAB — POCT RAPID STREP A (OFFICE): Rapid Strep A Screen: NEGATIVE

## 2023-11-13 MED ORDER — PREDNISONE 20 MG PO TABS: 40.0000 mg | ORAL_TABLET | Freq: Every day | ORAL | 0 refills | Status: AC

## 2023-11-13 MED ORDER — PROMETHAZINE-DM 6.25-15 MG/5ML PO SYRP: 5.0000 mL | ORAL_SOLUTION | Freq: Three times a day (TID) | ORAL | 0 refills | Status: AC | PRN

## 2023-11-13 NOTE — ED Triage Notes (Signed)
Pt presents with flu like symptoms and a sore throat. Pt states he had a low grade fever Saturday. No emesis. No SOB. No chest pains. Symptoms started on 11/10/23.

## 2023-11-13 NOTE — ED Provider Notes (Signed)
[facility]    CSN: [id number] Arrival date & time: 11/13/23  0843      History   Chief Complaint Chief Complaint  Patient presents with   Influenza    HPI Mark Ingram is a 58 y.o. male.   58 year old male who presents urgent care with complaints of sore throat and cough.  His symptoms started about 3 to 4 days ago.  He had a low-grade fever at first but that has resolved.  He is only having mild bodyaches and a mild headache.  He denies shortness of breath, chest pain, congestion, ear pain or other constitutional symptoms.  He is eating and drinking well.   Influenza Presenting symptoms: cough and sore throat   Presenting symptoms: no fever, no shortness of breath and no vomiting   Associated symptoms: no chills and no ear pain     Past Medical History:  Diagnosis Date   Diabetes mellitus without complication (HCC)    Hypertension     There are no active problems to display for this patient.   History reviewed. No pertinent surgical history.     Home Medications    Prior to Admission medications   Medication Sig Start Date End Date Taking? Authorizing Provider  gabapentin (NEURONTIN) 100 MG capsule Take 100 mg by mouth daily. 08/31/23  Yes [provider]  HUMALOG KWIKPEN 200 UNIT/ML KwikPen Inject into the skin. 07/18/23  Yes [provider]  predniSONE (DELTASONE) 10 MG tablet SMARTSIG:- Tablet(s) By Mouth - 09/30/23  Yes [provider]  rosuvastatin (CRESTOR) 20 MG tablet Take 20 mg by mouth daily. 10/03/23  Yes [provider]  TOUJEO MAX SOLOSTAR 300 UNIT/ML Solostar Pen  07/24/23  Yes [provider]  allopurinol (ZYLOPRIM) 100 MG tablet Take 100 mg by mouth daily.    [provider]  amLODipine (NORVASC) 5 MG tablet Take 5 mg by mouth daily. 06/12/22   [provider]  benzonatate (TESSALON) 100 MG capsule Take 1 capsule (100 mg total) by mouth every 8 (eight) hours as needed for  cough. 09/23/22   Gustavus Bryant, FNP  fluticasone (FLONASE) 50 MCG/ACT nasal spray Place 1 spray into both nostrils daily. 09/23/22   Gustavus Bryant, FNP  lisinopril (PRINIVIL,ZESTRIL) 40 MG tablet Take 40 mg by mouth daily. 11/21/15   [provider]  metFORMIN (GLUCOPHAGE) 1000 MG tablet Take 1,000 mg by mouth 2 (two) times daily with a meal.  11/21/15   [provider]  simvastatin (ZOCOR) 20 MG tablet Take 20 mg by mouth daily. 11/21/15   [provider]  telmisartan (MICARDIS) 80 MG tablet Take 80 mg by mouth daily. 06/12/22   [provider]  TRADJENTA 5 MG TABS tablet Take 5 mg by mouth daily. 11/13/15   [provider]    Family History History reviewed. No pertinent family history.  Social History Social History   Tobacco Use   Smoking status: Never  Vaping Use   Vaping status: Never Used  Substance Use Topics   Alcohol use: No   Drug use: No     Allergies   Patient has no known allergies.   Review of Systems Review of Systems  Constitutional:  Negative for chills and fever.  HENT:  Positive for sore throat. Negative for ear pain.   Eyes:  Negative for pain and visual disturbance.  Respiratory:  Positive for cough. Negative for shortness of breath.   Cardiovascular:  Negative for chest pain and  palpitations.  Gastrointestinal:  Negative for abdominal pain and vomiting.  Genitourinary:  Negative for dysuria and hematuria.  Musculoskeletal:  Negative for arthralgias and back pain.  Skin:  Negative for color change and rash.  Neurological:  Negative for seizures and syncope.  All other systems reviewed and are negative.    Physical Exam Triage Vital Signs ED Triage Vitals  Encounter Vitals Group     BP 11/13/23 1127 (!) 136/93     Systolic BP Percentile --      Diastolic BP Percentile --      Pulse Rate 11/13/23 1127 92     Resp 11/13/23 1127 20     Temp 11/13/23 1127 98.9 F (37.2 C)     Temp Source 11/13/23 1127  Oral     SpO2 11/13/23 1127 96 %     Weight 11/13/23 1126 190 lb (86.2 kg)     Height 11/13/23 1126 6' (1.829 m)     Head Circumference --      Peak Flow --      Pain Score 11/13/23 1123 0     Pain Loc --      Pain Education --      Exclude from Growth Chart --    No data found.  Updated Vital Signs BP (!) 136/93 (BP Location: Left Arm)   Pulse 92   Temp 98.9 F (37.2 C) (Oral)   Resp 20   Ht 6' (1.829 m)   Wt 190 lb (86.2 kg)   SpO2 96%   BMI 25.77 kg/m   Visual Acuity Right Eye Distance:   Left Eye Distance:   Bilateral Distance:    Right Eye Near:   Left Eye Near:    Bilateral Near:     Physical Exam Vitals and nursing note reviewed.  Constitutional:      General: He is not in acute distress.    Appearance: He is well-developed.  HENT:     Head: Normocephalic and atraumatic.     Right Ear: Tympanic membrane normal.     Left Ear: Tympanic membrane normal.     Nose: Nose normal.     Mouth/Throat:     Mouth: Mucous membranes are moist.     Pharynx: Posterior oropharyngeal erythema (mild) present.  Eyes:     Conjunctiva/sclera: Conjunctivae normal.  Cardiovascular:     Rate and Rhythm: Normal rate and regular rhythm.     Heart sounds: No murmur heard. Pulmonary:     Effort: Pulmonary effort is normal. No respiratory distress.     Breath sounds: Normal breath sounds.  Abdominal:     Palpations: Abdomen is soft.     Tenderness: There is no abdominal tenderness.  Musculoskeletal:        General: No swelling.     Cervical back: Neck supple.  Skin:    General: Skin is warm and dry.     Capillary Refill: Capillary refill takes less than 2 seconds.  Neurological:     Mental Status: He is alert.  Psychiatric:        Mood and Affect: Mood normal.      UC Treatments / Results  Labs (all labs ordered are listed, but only abnormal results are displayed) Labs Reviewed  POCT RAPID STREP A (OFFICE) - Normal  POCT INFLUENZA A/B - Normal     EKG   Radiology No results found.  Procedures Procedures (including critical care time)  Medications Ordered in UC Medications - No data to display  Initial Impression / Assessment and Plan / UC Course  I have reviewed the triage vital signs and the nursing notes.  Pertinent labs & imaging results that were available during my care of the patient were reviewed by me and considered in my medical decision making (see chart for details).     Viral upper respiratory tract infection with cough   Flu A and Flu B are negative and strep test is negative.  Symptoms are most consistent with a viral upper respiratory illness.  This does not require antibiotics. Will do symptom management.  We will treat with the following: Promethazine DM 5 mL every 8 hours as needed for cough.  Use caution as this medication can cause drowsiness. Prednisone 40 mg daily for 5 days. Take this in the morning. This is a steroid to help with inflammation and pain Rest and stay hydrated.  Return to urgent care or PCP if symptoms worsen or fail to resolve.    Final Clinical Impressions(s) / UC Diagnoses   Final diagnoses:  None     Discharge Instructions       Promethazine DM 5 mL every 8 hours as needed for cough.  Use caution as this medication can cause drowsiness. Prednisone 40 mg daily for 5 days. Take this in the morning.    ED Prescriptions   None    PDMP not reviewed this encounter.   Landis Martins, [place] 11/13/23 1157

## 2023-11-13 NOTE — Discharge Instructions (Addendum)
Flu A and Flu B are negative and strep test is negative.  Symptoms are most consistent with a viral upper respiratory illness.  This does not require antibiotics.  We will treat with the following: Promethazine DM 5 mL every 8 hours as needed for cough.  Use caution as this medication can cause drowsiness. Prednisone 40 mg daily for 5 days. Take this in the morning. This is a steroid to help with inflammation and pain Rest and stay hydrated.  Return to urgent care or PCP if symptoms worsen or fail to resolve.
